# Patient Record
Sex: Female | Born: 1985 | Race: Black or African American | Hispanic: No | Marital: Single | Smoking: Never smoker
Health system: Southern US, Community
[De-identification: ages and names within clinical notes are randomized; demographics above are authoritative.]

## PROBLEM LIST (undated history)

## (undated) DIAGNOSIS — T7840XA Allergy, unspecified, initial encounter: Secondary | ICD-10-CM

## (undated) DIAGNOSIS — I10 Essential (primary) hypertension: Secondary | ICD-10-CM

## (undated) HISTORY — PX: WISDOM TOOTH EXTRACTION: SHX21

## (undated) HISTORY — PX: TONSILLECTOMY: SUR1361

---
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
## 2014-06-03 ENCOUNTER — Encounter (HOSPITAL_COMMUNITY): Payer: Self-pay | Admitting: Emergency Medicine

## 2014-06-03 ENCOUNTER — Emergency Department (HOSPITAL_COMMUNITY)
Admission: EM | Admit: 2014-06-03 | Discharge: 2014-06-03 | Disposition: A | Discharge status: Home / Self Care | Payer: Self-pay | Attending: Emergency Medicine | Admitting: Emergency Medicine

## 2014-06-03 ENCOUNTER — Emergency Department (HOSPITAL_COMMUNITY): Payer: Self-pay

## 2014-06-03 DIAGNOSIS — J069 Acute upper respiratory infection, unspecified: Secondary | ICD-10-CM | POA: Insufficient documentation

## 2014-06-03 MED ORDER — PROMETHAZINE-CODEINE 6.25-10 MG/5ML PO SYRP: 5.0000 mL | ORAL_SOLUTION | Freq: Four times a day (QID) | ORAL | Status: DC | PRN

## 2014-06-03 MED ORDER — LORATADINE-PSEUDOEPHEDRINE ER 5-120 MG PO TB12: 1.0000 | ORAL_TABLET | Freq: Two times a day (BID) | ORAL | Status: DC

## 2014-06-03 NOTE — ED Notes (Signed)
C/o cold symptoms and cough. Symptoms worse at night. nad noted in triage.

## 2014-06-03 NOTE — ED Provider Notes (Signed)
CSN: [id number]     Arrival date & time 06/03/14  1153 History   This chart was scribed for non-physician practitioner, Ivery QualeHobson Deriona Altemose, PA-C working with Flint MelterElliott L Wentz, MD by Luisa DagoPriscilla Tutu, ED scribe. This patient was seen in room APFT21/[facility]21 and the patient's care was started at 2:38 PM.    Chief Complaint  Patient presents with  . Cough    The history is provided by the patient. No language interpreter was used.   HPI Comments: Kimberly Fletcher is a 28 y.o. female who presents to the Emergency Department complaining of a worsening constant cought that started approximately 1 weeks ago. Pt states that the cough is worse at night. She reports using home remedies and OTC medication with moderate relief. Pt states that her family at home has been sick. Denies using inhalers, home oxygen, SOB, fever, chills, abdominal pain, nausea, or myalgias.   History reviewed. No pertinent past medical history. History reviewed. No pertinent past surgical history. No family history on file. History  Substance Use Topics  . Smoking status: Never Smoker   . Smokeless tobacco: Not on file  . Alcohol Use: Yes     Comment: ocassional   OB History   Grav Para Term Preterm Abortions TAB SAB Ect Mult Living                 Review of Systems  Constitutional: Negative for fever and chills.  HENT: Positive for congestion. Negative for ear pain, rhinorrhea, sinus pressure, sore throat, trouble swallowing and voice change.   Eyes: Negative for discharge.  Respiratory: Positive for cough. Negative for shortness of breath, wheezing and stridor.   Cardiovascular: Negative for chest pain.  Gastrointestinal: Negative for abdominal pain.  Genitourinary: Negative.   All other systems reviewed and are negative.  Allergies  Peanut-containing drug products  Home Medications   Prior to Admission medications   Not on File   BP 150/93  Pulse 101  Temp(Src) 98.5 F (36.9 C)  Resp 18  Ht 5\' 9"  (1.753 m)   Wt 195 lb (88.451 kg)  BMI 28.78 kg/m2  SpO2 98%  LMP 05/04/2014  Physical Exam  Nursing note and vitals reviewed. Constitutional: She is oriented to person, place, and time. She appears well-developed and well-nourished. No distress.  HENT:  Head: Normocephalic and atraumatic.  Right Ear: External ear normal.  Left Ear: External ear normal.  Mouth/Throat: No oropharyngeal exudate.  Mild nasal congestion. Airway is patent. Minimal redness of the oropharynx.   Eyes: Conjunctivae and EOM are normal. Pupils are equal, round, and reactive to light.  Neck: Normal range of motion. Neck supple. No thyromegaly present.  Cardiovascular: Normal rate, regular rhythm and normal heart sounds.  Exam reveals no gallop and no friction rub.   No murmur heard. Pulmonary/Chest: Effort normal and breath sounds normal. No respiratory distress. She has no wheezes. She has no rales.  Musculoskeletal: Normal range of motion.  Lymphadenopathy:    She has no cervical adenopathy.  Neurological: She is alert and oriented to person, place, and time.  Skin: Skin is warm and dry.  Psychiatric: She has a normal mood and affect. Her behavior is normal.    ED Course  Procedures (including critical care time)  DIAGNOSTIC STUDIES: Oxygen Saturation is 98% on RA, normal by my interpretation.    COORDINATION OF CARE: 2:41 PM- Will prescribe cough medication. Pt advised of plan for treatment and pt agrees.  Imaging Review Dg Chest 2 View  06/03/2014  CLINICAL DATA:  Productive cough  EXAM: CHEST  2 VIEW  COMPARISON:  None.  FINDINGS: The heart size and mediastinal contours are within normal limits. Both lungs are clear. The visualized skeletal structures are unremarkable.  IMPRESSION: No active cardiopulmonary disease.   Electronically Signed   By: Herbie Baltimore M.D.   On: 06/03/2014 13:15    MDM Chest x-ray is negative for acute changes. Vital signs are well within normal limits, with the exception of the  blood pressure being 150/93. Patient advised to have her blood pressure rechecked. Pulse oximetry is 98% on room air. Within normal limits by my interpretation. The examination is consistent with upper respiratory findings, no acute problem identified at this time. The patient will use Claritin-D 2 times daily for congestion, she will use promethazine codeine cough medication for assistance with cough. She is to return to the emergency department if not improving.    Final diagnoses:  None    **I have reviewed nursing notes, vital signs, and all appropriate lab and imaging results for this patient.*  I personally performed the services described in this documentation, which was scribed in my presence. The recorded information has been reviewed and is accurate.    Kathie Dike, PA-C 06/03/14 [phone]

## 2014-06-03 NOTE — Discharge Instructions (Signed)
Your chest x-ray is negative for any acute problem. Please use Claritin-D every 12 hours. May use promethazine codeine cough medication if needed for cough. This medication may cause drowsiness, please use with caution. Please wash hands frequently. Please increase fluids. Upper Respiratory Infection, Adult An upper respiratory infection (URI) is also sometimes known as the common cold. The upper respiratory tract includes the nose, sinuses, throat, trachea, and bronchi. Bronchi are the airways leading to the lungs. Most people improve within 1 week, but symptoms can last up to 2 weeks. A residual cough may last even longer.  CAUSES Many different viruses can infect the tissues lining the upper respiratory tract. The tissues become irritated and inflamed and often become very moist. Mucus production is also common. A cold is contagious. You can easily spread the virus to others by oral contact. This includes kissing, sharing a glass, coughing, or sneezing. Touching your mouth or nose and then touching a surface, which is then touched by another person, can also spread the virus. SYMPTOMS  Symptoms typically develop 1 to 3 days after you come in contact with a cold virus. Symptoms vary from person to person. They may include:  Runny nose.  Sneezing.  Nasal congestion.  Sinus irritation.  Sore throat.  Loss of voice (laryngitis).  Cough.  Fatigue.  Muscle aches.  Loss of appetite.  Headache.  Low-grade fever. DIAGNOSIS  You might diagnose your own cold based on familiar symptoms, since most people get a cold 2 to 3 times a year. Your caregiver can confirm this based on your exam. Most importantly, your caregiver can check that your symptoms are not due to another disease such as strep throat, sinusitis, pneumonia, asthma, or epiglottitis. Blood tests, throat tests, and X-rays are not necessary to diagnose a common cold, but they may sometimes be helpful in excluding other more serious  diseases. Your caregiver will decide if any further tests are required. RISKS AND COMPLICATIONS  You may be at risk for a more severe case of the common cold if you smoke cigarettes, have chronic heart disease (such as heart failure) or lung disease (such as asthma), or if you have a weakened immune system. The very young and very old are also at risk for more serious infections. Bacterial sinusitis, middle ear infections, and bacterial pneumonia can complicate the common cold. The common cold can worsen asthma and chronic obstructive pulmonary disease (COPD). Sometimes, these complications can require emergency medical care and may be life-threatening. PREVENTION  The best way to protect against getting a cold is to practice good hygiene. Avoid oral or hand contact with people with cold symptoms. Wash your hands often if contact occurs. There is no clear evidence that vitamin C, vitamin E, echinacea, or exercise reduces the chance of developing a cold. However, it is always recommended to get plenty of rest and practice good nutrition. TREATMENT  Treatment is directed at relieving symptoms. There is no cure. Antibiotics are not effective, because the infection is caused by a virus, not by bacteria. Treatment may include:  Increased fluid intake. Sports drinks offer valuable electrolytes, sugars, and fluids.  Breathing heated mist or steam (vaporizer or shower).  Eating chicken soup or other clear broths, and maintaining good nutrition.  Getting plenty of rest.  Using gargles or lozenges for comfort.  Controlling fevers with ibuprofen or acetaminophen as directed by your caregiver.  Increasing usage of your inhaler if you have asthma. Zinc gel and zinc lozenges, taken in the first 24  hours of the common cold, can shorten the duration and lessen the severity of symptoms. Pain medicines may help with fever, muscle aches, and throat pain. A variety of non-prescription medicines are available to  treat congestion and runny nose. Your caregiver can make recommendations and may suggest nasal or lung inhalers for other symptoms.  HOME CARE INSTRUCTIONS   Only take over-the-counter or prescription medicines for pain, discomfort, or fever as directed by your caregiver.  Use a warm mist humidifier or inhale steam from a shower to increase air moisture. This may keep secretions moist and make it easier to breathe.  Drink enough water and fluids to keep your urine clear or pale yellow.  Rest as needed.  Return to work when your temperature has returned to normal or as your caregiver advises. You may need to stay home longer to avoid infecting others. You can also use a face mask and careful hand washing to prevent spread of the virus. SEEK MEDICAL CARE IF:   After the first few days, you feel you are getting worse rather than better.  You need your caregiver's advice about medicines to control symptoms.  You develop chills, worsening shortness of breath, or brown or red sputum. These may be signs of pneumonia.  You develop yellow or brown nasal discharge or pain in the face, especially when you bend forward. These may be signs of sinusitis.  You develop a fever, swollen neck glands, pain with swallowing, or white areas in the back of your throat. These may be signs of strep throat. SEEK IMMEDIATE MEDICAL CARE IF:   You have a fever.  You develop severe or persistent headache, ear pain, sinus pain, or chest pain.  You develop wheezing, a prolonged cough, cough up blood, or have a change in your usual mucus (if you have chronic lung disease).  You develop sore muscles or a stiff neck. Document Released: 05/04/2001 Document Revised: 01/31/2012 Document Reviewed: 03/12/2011 [facility] Patient Information 2015 [place], [place]. This information is not intended to replace advice given to you by your health care provider. Make sure you discuss any questions you have with your health care  provider.

## 2014-06-04 NOTE — ED Provider Notes (Signed)
Medical screening examination/treatment/procedure(s) were performed by non-physician practitioner and as supervising physician I was immediately available for consultation/collaboration.  Lasaro Primm L Raquel Racey, MD 06/04/14 1551 

## 2015-10-07 ENCOUNTER — Emergency Department (HOSPITAL_COMMUNITY)
Admission: EM | Admit: 2015-10-07 | Discharge: 2015-10-07 | Disposition: A | Discharge status: Home / Self Care | Payer: Self-pay | Attending: Emergency Medicine | Admitting: Emergency Medicine

## 2015-10-07 ENCOUNTER — Encounter (HOSPITAL_COMMUNITY): Payer: Self-pay | Admitting: Emergency Medicine

## 2015-10-07 ENCOUNTER — Emergency Department (HOSPITAL_COMMUNITY): Payer: Self-pay

## 2015-10-07 DIAGNOSIS — Z79899 Other long term (current) drug therapy: Secondary | ICD-10-CM | POA: Insufficient documentation

## 2015-10-07 DIAGNOSIS — I10 Essential (primary) hypertension: Secondary | ICD-10-CM | POA: Insufficient documentation

## 2015-10-07 DIAGNOSIS — J209 Acute bronchitis, unspecified: Secondary | ICD-10-CM | POA: Insufficient documentation

## 2015-10-07 HISTORY — DX: Essential (primary) hypertension: I10

## 2015-10-07 HISTORY — DX: Allergy, unspecified, initial encounter: T78.40XA

## 2015-10-07 MED ORDER — PROMETHAZINE-CODEINE 6.25-10 MG/5ML PO SYRP: 5.0000 mL | ORAL_SOLUTION | ORAL | Status: AC | PRN

## 2015-10-07 MED ORDER — PROMETHAZINE-CODEINE 6.25-10 MG/5ML PO SYRP: 5.0000 mL | ORAL_SOLUTION | ORAL | Status: DC | PRN

## 2015-10-07 MED ORDER — ALBUTEROL SULFATE HFA 108 (90 BASE) MCG/ACT IN AERS
2.0000 | INHALATION_SPRAY | Freq: Once | RESPIRATORY_TRACT | Status: AC
  Administered 2015-10-07: 2 via RESPIRATORY_TRACT
  Filled 2015-10-07: qty 6.7

## 2015-10-07 NOTE — ED Notes (Signed)
Pt c/o cough and nasal congestion x2weeks.  °

## 2015-10-07 NOTE — Discharge Instructions (Signed)
Acute Bronchitis Bronchitis is inflammation of the airways that extend from the windpipe into the lungs (bronchi). The inflammation often causes mucus to develop. This leads to a cough, which is the most common symptom of bronchitis.  In acute bronchitis, the condition usually develops suddenly and goes away over time, usually in a couple weeks. Smoking, allergies, and asthma can make bronchitis worse. Repeated episodes of bronchitis may cause further lung problems.  CAUSES Acute bronchitis is most often caused by the same virus that causes a cold. The virus can spread from person to person (contagious) through coughing, sneezing, and touching contaminated objects. SIGNS AND SYMPTOMS   Cough.   Fever.   Coughing up mucus.   Body aches.   Chest congestion.   Chills.   Shortness of breath.   Sore throat.  DIAGNOSIS  Acute bronchitis is usually diagnosed through a physical exam. Your health care provider will also ask you questions about your medical history. Tests, such as chest X-rays, are sometimes done to rule out other conditions.  TREATMENT  Acute bronchitis usually goes away in a couple weeks. Oftentimes, no medical treatment is necessary. Medicines are sometimes given for relief of fever or cough. Antibiotic medicines are usually not needed but may be prescribed in certain situations. In some cases, an inhaler may be recommended to help reduce shortness of breath and control the cough. A cool mist vaporizer may also be used to help thin bronchial secretions and make it easier to clear the chest.  HOME CARE INSTRUCTIONS  Get plenty of rest.   Drink enough fluids to keep your urine clear or pale yellow (unless you have a medical condition that requires fluid restriction). Increasing fluids may help thin your respiratory secretions (sputum) and reduce chest congestion, and it will prevent dehydration.   Take medicines only as directed by your health care provider.  If  you were prescribed an antibiotic medicine, finish it all even if you start to feel better.  Avoid smoking and secondhand smoke. Exposure to cigarette smoke or irritating chemicals will make bronchitis worse. If you are a smoker, consider using nicotine gum or skin patches to help control withdrawal symptoms. Quitting smoking will help your lungs heal faster.   Reduce the chances of another bout of acute bronchitis by washing your hands frequently, avoiding people with cold symptoms, and trying not to touch your hands to your mouth, nose, or eyes.   Keep all follow-up visits as directed by your health care provider.  SEEK MEDICAL CARE IF: Your symptoms do not improve after 1 week of treatment.  SEEK IMMEDIATE MEDICAL CARE IF:  You develop an increased fever or chills.   You have chest pain.   You have severe shortness of breath.  You have bloody sputum.   You develop dehydration.  You faint or repeatedly feel like you are going to pass out.  You develop repeated vomiting.  You develop a severe headache. MAKE SURE YOU:   Understand these instructions.  Will watch your condition.  Will get help right away if you are not doing well or get worse.   This information is not intended to replace advice given to you by your health care provider. Make sure you discuss any questions you have with your health care provider.    Use the cough syrup prescribed if needed - remember that this will make your drowsy - do not drive within 4 hours of taking.  Use your inhaler 2 puffs every 4 hours for  cough or wheezing (the train whistle you describe).

## 2015-10-08 NOTE — ED Provider Notes (Signed)
CSN: [id number]     Arrival date & time 10/07/15  1200 History   First MD Initiated Contact with Patient 10/07/15 1229     Chief Complaint  Patient presents with  . Cough     (Consider location/radiation/quality/duration/timing/severity/associated sxs/prior Treatment) The history is provided by the patient.   Kimberly Fletcher is a 29 y.o. female with past medical history significant for seasonal allergies presenting with a 2 week history of a persistent cough along with nasal congestion, mild sore throat, clear rhinorrhea and post nasal drip.  Her cough has been occasionally productive of a clear sputum, most prominent when she first wakes, but has been predominantly non productive during the day.  She also notes faint wheezing sounds mostly when lying down to sleep. She denies shortness of breath, chest pain.  She may have had a low grade fever when symptoms originally began which has resolved.  She has taken an otc cough tablet and used cough lozenges which provide temporary relief.  She denies smoking or smoke exposure.  States sx similar to last time she had bronchitis.     Past Medical History  Diagnosis Date  . Hypertension   . Allergy    Past Surgical History  Procedure Laterality Date  . Wisdom tooth extraction    . Tonsillectomy     Family History  Problem Relation Age of Onset  . Adopted: Yes   Social History  Substance Use Topics  . Smoking status: Never Smoker   . Smokeless tobacco: Never Used  . Alcohol Use: Yes     Comment: ocassional   OB History    No data available     Review of Systems  Constitutional: Positive for fever. Negative for chills.  HENT: Positive for congestion, postnasal drip, rhinorrhea and sore throat. Negative for ear pain, sinus pressure, trouble swallowing and voice change.   Eyes: Negative for discharge.  Respiratory: Positive for cough and wheezing. Negative for shortness of breath and stridor.   Cardiovascular: Negative for chest  pain, palpitations and leg swelling.  Gastrointestinal: Negative for abdominal pain.  Genitourinary: Negative.       Allergies  Peanut-containing drug products  Home Medications   Prior to Admission medications   Medication Sig Start Date End Date Taking? Authorizing Provider  DM-APAP-CPM 15-500-2 MG TABS Take 2 tablets by mouth 2 (two) times daily as needed (cough).   Yes Historical Provider, MD  EPINEPHrine (EPIPEN) 0.3 mg/0.3 mL IJ SOAJ injection Inject 0.3 mg into the muscle once.   Yes Historical Provider, MD  Multiple Vitamin (MULTIVITAMIN WITH MINERALS) TABS tablet Take 1 tablet by mouth daily.   Yes Historical Provider, MD  oxymetazoline (AFRIN 12 HOUR) 0.05 % nasal spray Place 2 sprays into both nostrils 2 (two) times daily as needed for congestion.   Yes Historical Provider, MD  promethazine-codeine (PHENERGAN WITH CODEINE) 6.25-10 MG/5ML syrup Take 5 mLs by mouth every 4 (four) hours as needed for cough. 10/07/15   Burgess AmorJulie Lolitha Tortora, PA-C   BP 137/96 mmHg  Pulse 108  Temp(Src) 98.9 F (37.2 C) (Oral)  Resp 16  Ht 5\' 9"  (1.753 m)  Wt 205 lb (92.987 kg)  BMI 30.26 kg/m2  SpO2 100%  LMP 09/13/2015 Physical Exam  Constitutional: She is oriented to person, place, and time. She appears well-developed and well-nourished.  HENT:  Head: Normocephalic and atraumatic.  Right Ear: Tympanic membrane and ear canal normal.  Left Ear: Tympanic membrane and ear canal normal.  Nose: Mucosal edema present. No  rhinorrhea.  Mouth/Throat: Uvula is midline, oropharynx is clear and moist and mucous membranes are normal. No oropharyngeal exudate, posterior oropharyngeal edema, posterior oropharyngeal erythema or tonsillar abscesses.  Eyes: Conjunctivae are normal.  Neck: Normal range of motion.  Cardiovascular: Regular rhythm and normal heart sounds.   Borderline tachy  Pulmonary/Chest: Effort normal. No stridor. No respiratory distress. She has no decreased breath sounds. She has no wheezes.  She has no rhonchi. She has no rales.  Coarse breath sounds, no wheezing.  Abdominal: Soft. There is no tenderness.  Musculoskeletal: Normal range of motion. She exhibits no edema or tenderness.  Neurological: She is alert and oriented to person, place, and time.  Skin: Skin is warm and dry. No rash noted.  Psychiatric: She has a normal mood and affect.    ED Course  Procedures (including critical care time) Labs Review Labs Reviewed - No data to display  Imaging Review Dg Chest 2 View  10/07/2015  CLINICAL DATA:  Nonproductive cough for 2 weeks.  Wheezing. EXAM: CHEST  2 VIEW COMPARISON:  06/03/2014 FINDINGS: The heart size and mediastinal contours are within normal limits. Both lungs are clear. The visualized skeletal structures are unremarkable. IMPRESSION: 1. Normal radiographic appearance of the chest. Electronically Signed   By: Gaylyn Rong M.D.   On: 10/07/2015 12:34   I have personally reviewed and evaluated these images and lab results as part of my medical decision-making.   EKG Interpretation None      MDM   Final diagnoses:  Acute bronchitis, unspecified organism    Pt with sx suggesting bronchitis/ bronchospasm.  No wheezing on exam at this time. She was given phenergan/codeine cough syrup, cautioned re sedation.  Albuterol mdi with spacer also given with instructions for home use.  She was borderline tachycardic during exam.  She has no risk factors or sx suggesting PE.  CXR clear, no pneumonia.  encoruraged increased fluid intake, rest, recheck here for any worsening sx (fever, sob, weakness).      Burgess Amor, PA-C 10/08/15 6962  Bethann Berkshire, MD 10/10/15 [phone]

## 2016-08-26 IMAGING — DX DG CHEST 2V
2 series · 2 of 2 positions shown · non-contrast
Comparison: 06/03/2014

CLINICAL DATA: Nonproductive cough for 2 weeks.  Wheezing.

EXAM:
CHEST  2 VIEW

[chest pa]
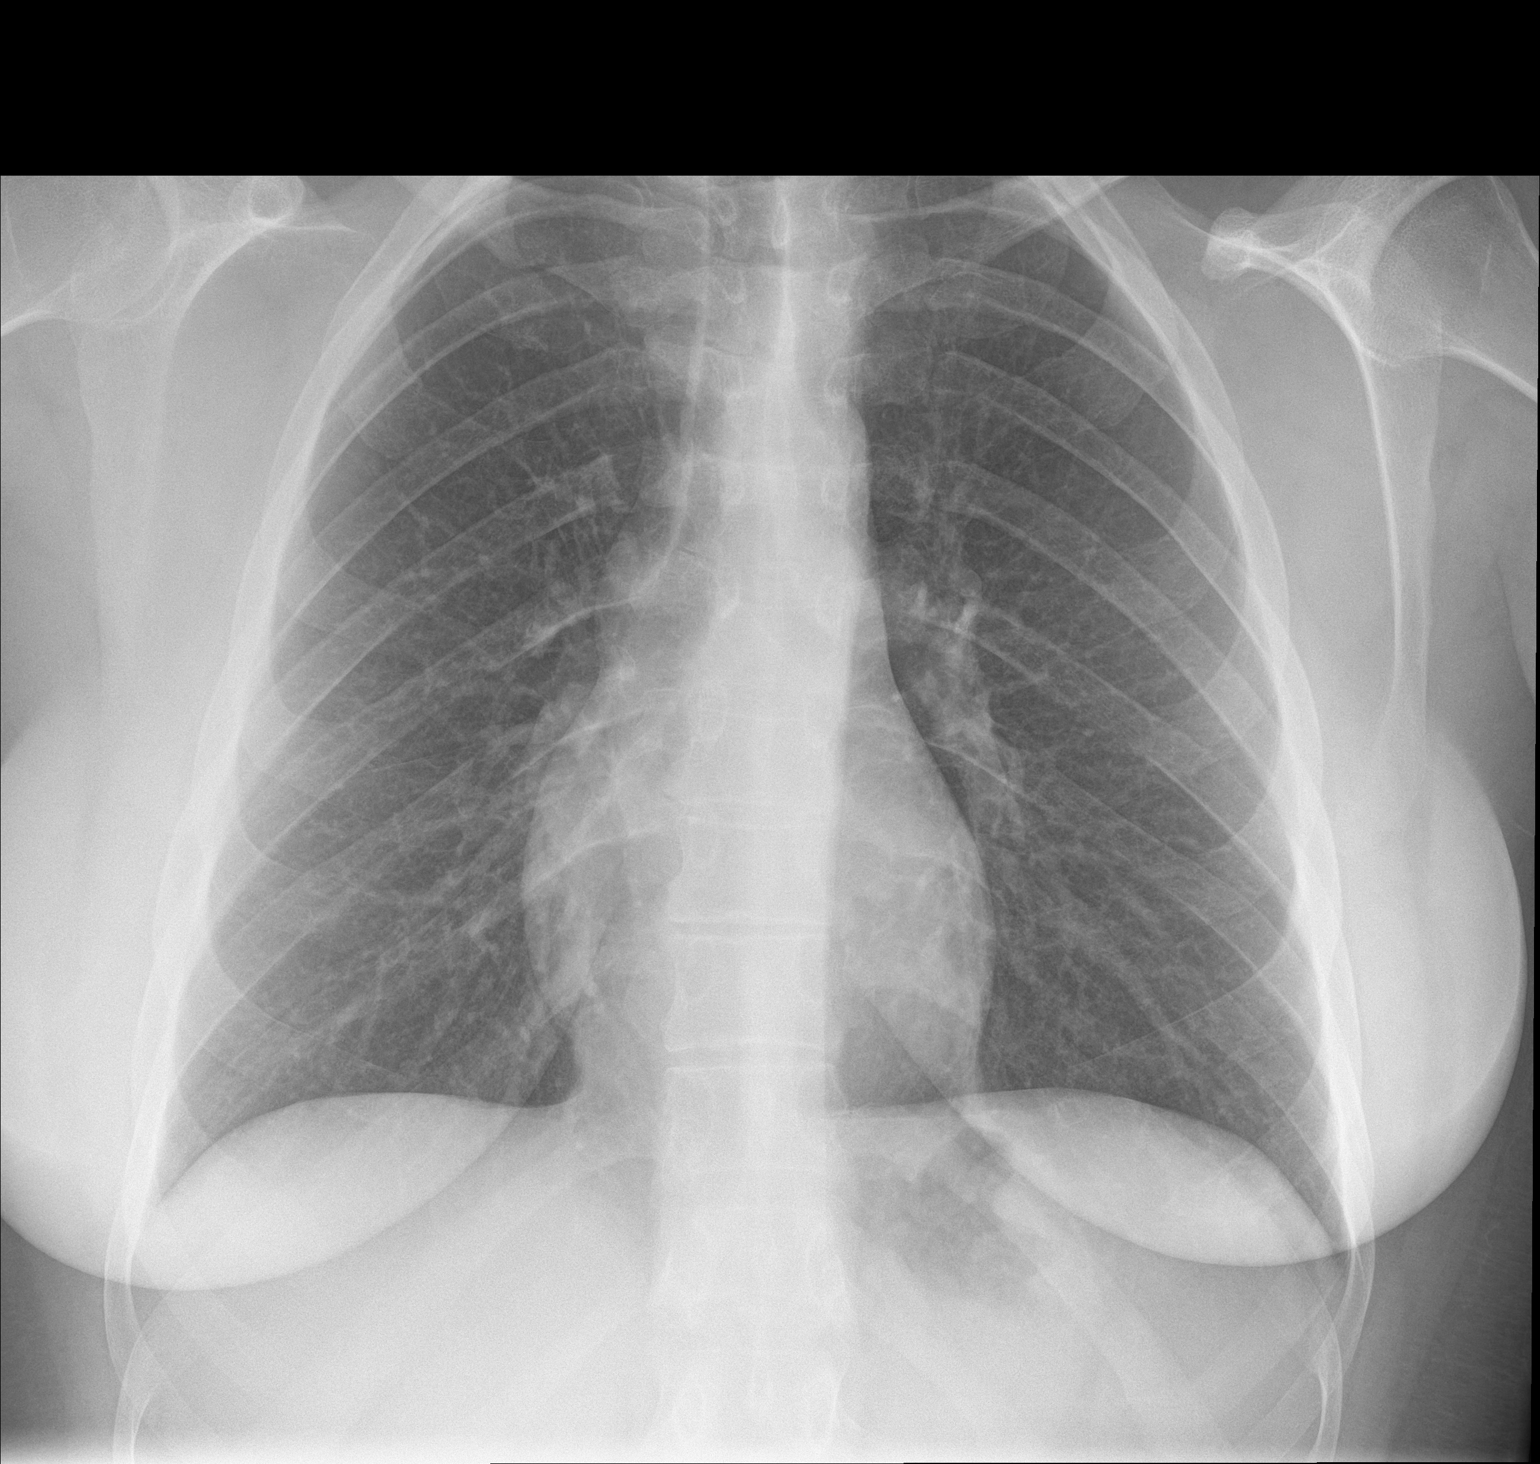

[chest lat]
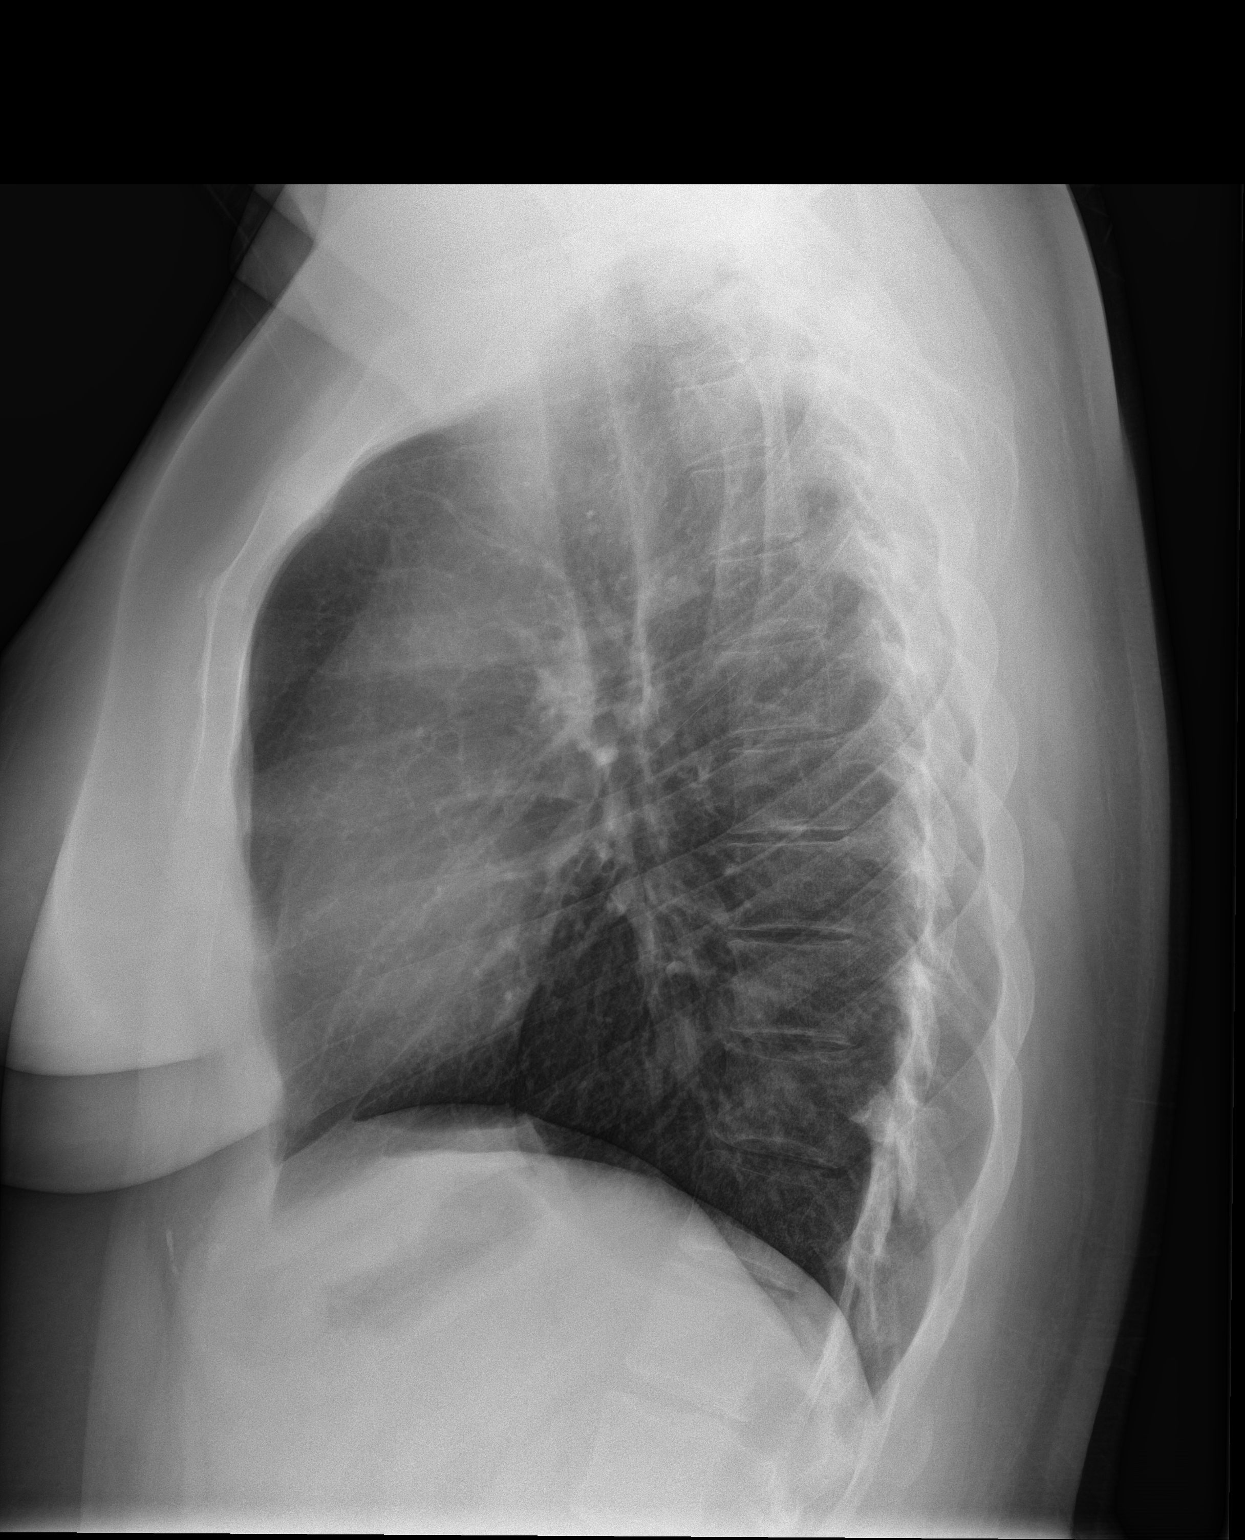

[2 of 2 positions shown; findings below may reference images not displayed]

FINDINGS: The heart size and mediastinal contours are within normal limits.
Both lungs are clear. The visualized skeletal structures are
unremarkable.
IMPRESSION: 1. Normal radiographic appearance of the chest.

## 2018-03-31 ENCOUNTER — Encounter: Payer: Self-pay | Admitting: *Deleted

## 2018-03-31 NOTE — Progress Notes (Signed)
Entered in error
